# Patient Record
Sex: Female | Born: 1990 | Race: White | Hispanic: No | Marital: Single | Smoking: Current every day smoker
Health system: Southern US, Community
[De-identification: ages and names within clinical notes are randomized; demographics above are authoritative.]

## PROBLEM LIST (undated history)

## (undated) HISTORY — PX: CHOLECYSTECTOMY: SHX55

## (undated) HISTORY — PX: APPENDECTOMY: SHX54

---
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
## 2011-09-03 ENCOUNTER — Emergency Department (HOSPITAL_COMMUNITY)
Admission: EM | Admit: 2011-09-03 | Discharge: 2011-09-03 | Disposition: A | Discharge status: Home / Self Care | Payer: Self-pay | Attending: Emergency Medicine | Admitting: Emergency Medicine

## 2011-09-03 ENCOUNTER — Encounter (HOSPITAL_COMMUNITY): Payer: Self-pay | Admitting: *Deleted

## 2011-09-03 DIAGNOSIS — H00019 Hordeolum externum unspecified eye, unspecified eyelid: Secondary | ICD-10-CM | POA: Insufficient documentation

## 2011-09-03 DIAGNOSIS — H00016 Hordeolum externum left eye, unspecified eyelid: Secondary | ICD-10-CM

## 2011-09-03 MED ORDER — ERYTHROMYCIN 5 MG/GM OP OINT
TOPICAL_OINTMENT | Freq: Once | OPHTHALMIC | Status: AC
  Administered 2011-09-03: 17:00:00 via OPHTHALMIC
  Filled 2011-09-03: qty 3.5

## 2011-09-03 NOTE — Discharge Instructions (Signed)
Apply a small ribbon of the antibiotic ointment to your left eye three times daily.  Wash your eyelid twice daily with baby shampoo as discussed.  Avoid rubbing your lid as this will make your swelling worse.  Call Dr. Lita Mains for a recheck of your eye if your symptoms are not improving over the next several days.

## 2011-09-03 NOTE — ED Notes (Signed)
Pt presents with swollen left eye lid with small sty like area noted. Pt denies vision changes, injury or trauma to the eye. Erythromycin ointment applied.

## 2011-09-03 NOTE — ED Notes (Signed)
Left eye swollen onset yesterday, no known injury

## 2011-09-03 NOTE — ED Provider Notes (Signed)
History     CSN: [id number]  Arrival date & time 09/03/11  1439   First MD Initiated Contact with Patient 09/03/11 1535      Chief Complaint  Patient presents with  . Eye Problem    (Consider location/radiation/quality/duration/timing/severity/associated sxs/prior treatment) HPI Comments: Lauren Vaughn presents for evaluation of left upper eyelid swelling and irritation since yesterday.  She denies any known injury or foreign body.  She denies any pain or decreased visual acuity.  She does not wear contact lenses or glasses.  Her right eye was itchy yesterday but not her left, and this symptom has resolved.  She denies any significant seasonal allergies.  She denies fevers or chills, rashes or drainage from her eye.  The history is provided by the patient.    History reviewed. No pertinent past medical history.  History reviewed. No pertinent past surgical history.  No family history on file.  History  Substance Use Topics  . Smoking status: Not on file  . Smokeless tobacco: Not on file  . Alcohol Use: Not on file    OB History    Grav Para Term Preterm Abortions TAB SAB Ect Mult Living                  Review of Systems  Constitutional: Negative for fever.  HENT: Negative for congestion, sore throat and neck pain.   Eyes: Negative.  Negative for photophobia, pain, discharge, redness and visual disturbance.  Respiratory: Negative for chest tightness and shortness of breath.   Cardiovascular: Negative for chest pain.  Gastrointestinal: Negative for nausea and abdominal pain.  Genitourinary: Negative.   Musculoskeletal: Negative for joint swelling and arthralgias.  Skin: Negative.  Negative for rash and wound.  Neurological: Negative for dizziness, weakness, light-headedness, numbness and headaches.  Hematological: Negative.   Psychiatric/Behavioral: Negative.     Allergies  Review of patient's allergies indicates no known allergies.  Home Medications    Current Outpatient Rx  Name Route Sig Dispense Refill  . IBUPROFEN 200 MG PO TABS Oral Take 400 mg by mouth once as needed. For pain      BP 137/66  Pulse 93  Temp(Src) 97.9 F (36.6 C) (Oral)  Resp 18  Ht 5\' 7"  (1.702 m)  Wt 220 lb (99.791 kg)  BMI 34.46 kg/m2  SpO2 100%  LMP 09/03/2011  Physical Exam  Nursing note and vitals reviewed. Constitutional: She appears well-developed and well-nourished.  HENT:  Head: Normocephalic and atraumatic.  Right Ear: Tympanic membrane and external ear normal.  Left Ear: Tympanic membrane and external ear normal.  Nose: Nose normal.  Eyes: Conjunctivae and EOM are normal. Pupils are equal, round, and reactive to light. Right eye exhibits no chemosis, no discharge and no exudate. Left eye exhibits hordeolum. Left eye exhibits no chemosis, no discharge and no exudate. No foreign body present in the left eye.       Slight erythema with edema of left upper eyelid.  Erythema is most pronounced on the along left lateral eyelid and eyelashes margin.  There is no pointing at this time.  No crusting of eyelashes.  Neck: Normal range of motion.  Cardiovascular: Normal rate.   Pulmonary/Chest: Effort normal and breath sounds normal.  Musculoskeletal: Normal range of motion.  Neurological: She is alert.  Skin: Skin is warm and dry.  Psychiatric: She has a normal mood and affect.    ED Course  Procedures (including critical care time)  Labs Reviewed - No data to  display No results found.   1. Stye, left       MDM  Probable left early stye.  Cannot rule out blepharitis.  Will treat with erythromycin ointment.  Patient also encouraged to gently wash her eyelids twice daily with baby shampoo prior to applying the ointment.  Recheck if not improving over the next several days.  Given Dr. Lita Mains for referral if symptoms worsen in any way.         Burgess Amor, [place] 09/03/11 [phone]

## 2011-09-04 NOTE — ED Provider Notes (Signed)
Medical screening examination/treatment/procedure(s) were performed by non-physician practitioner and as supervising physician I was immediately available for consultation/collaboration.  Angles Trevizo S. Kashish Yglesias, MD 09/04/11 1422 

## 2011-11-27 IMAGING — CT CT ABD-PELV W/ CM
2 of 4 series · 17 of 46 positions shown, 19 images · IV contrast (Omnipaque 300)
Comparison: None available

CLINICAL DATA: Right lower quadrant abdominal pain.  Nausea and
vomiting.

CT ABDOMEN AND PELVIS WITH CONTRAST
TECHNIQUE: Multidetector CT imaging of the abdomen and pelvis was
performed following the standard protocol during bolus
administration of intravenous contrast.
Contrast: 100 ml Omnipaque 300

[Series 2: abd_pel_with 5.0 b40f · axial · 0.90mm/px · z∈[-501,-71]mm · 14 of 94 slices shown, 16 images]
[im 4/94  soft-tissue]
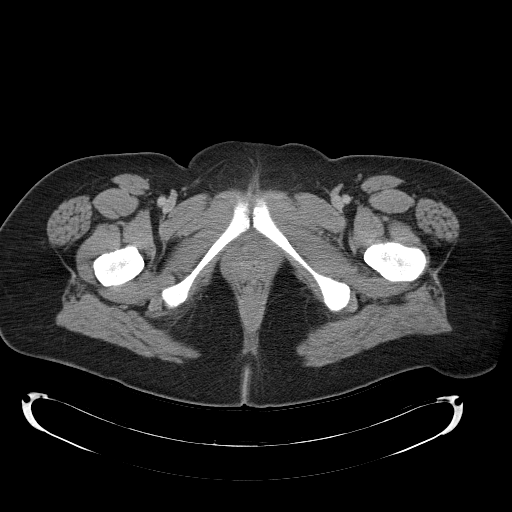
[im 4/94  bone]
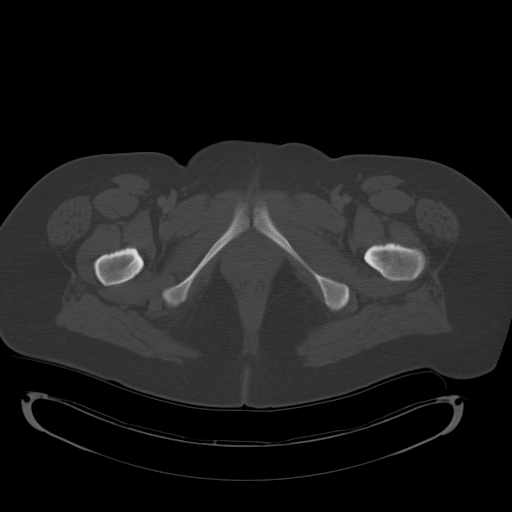
[im 11/94  soft-tissue]
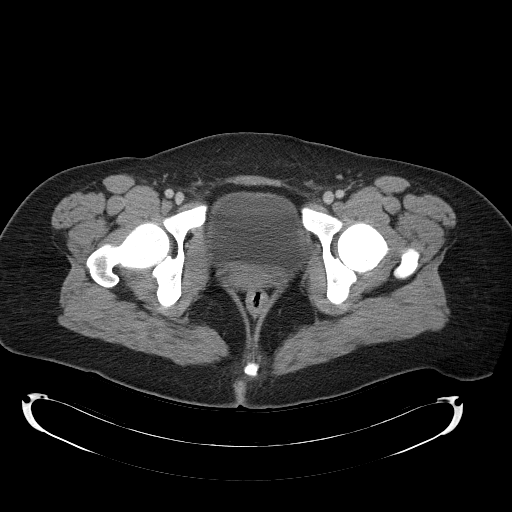
[im 18/94  soft-tissue]
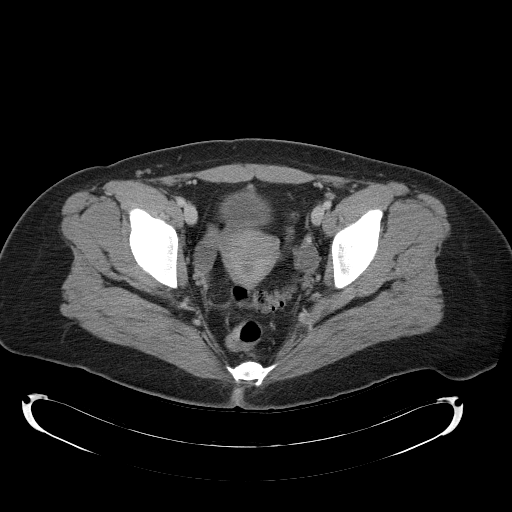
[im 26/94  soft-tissue]
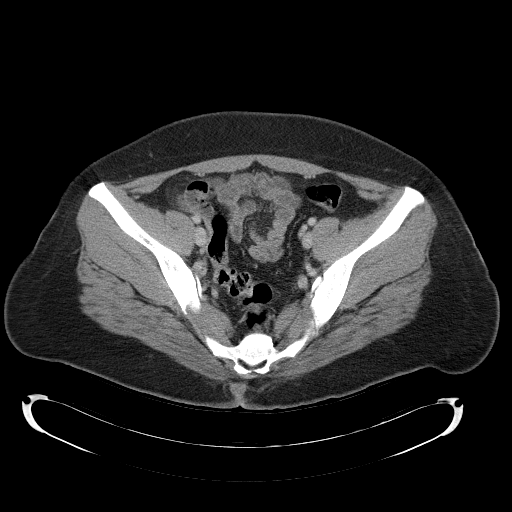
[im 33/94  soft-tissue]
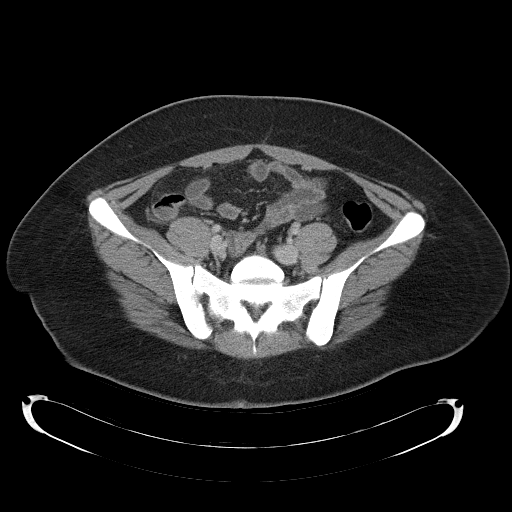
[im 36/94  soft-tissue]
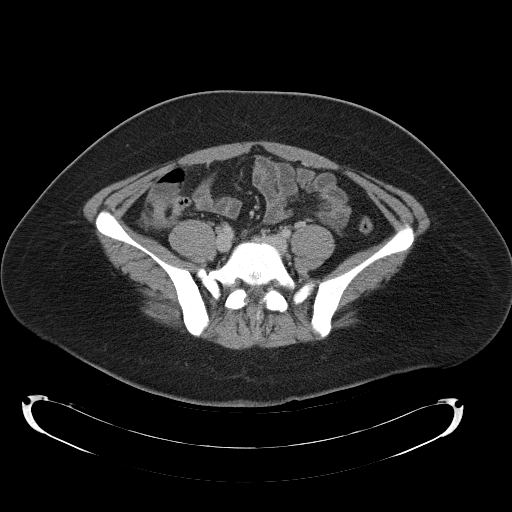
[im 43/94  soft-tissue]
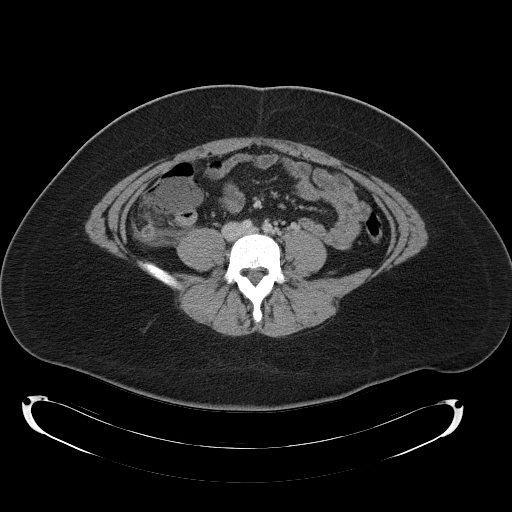
[im 51/94  soft-tissue]
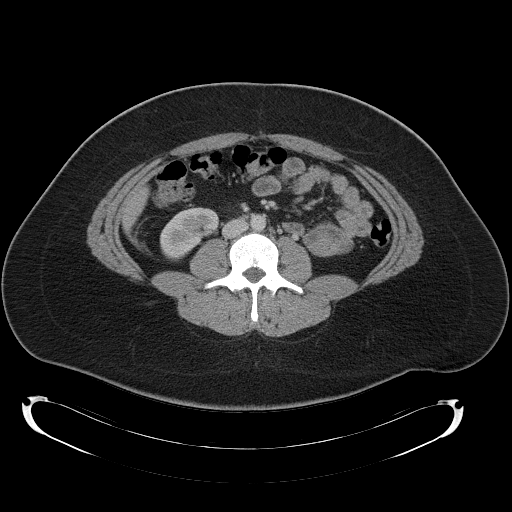
[im 58/94  soft-tissue]
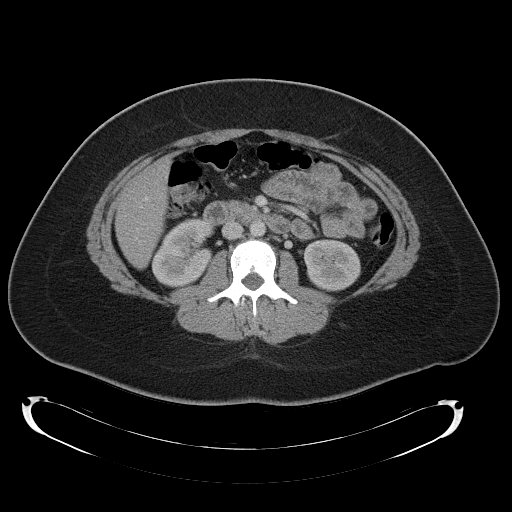
[im 58/94  bone]
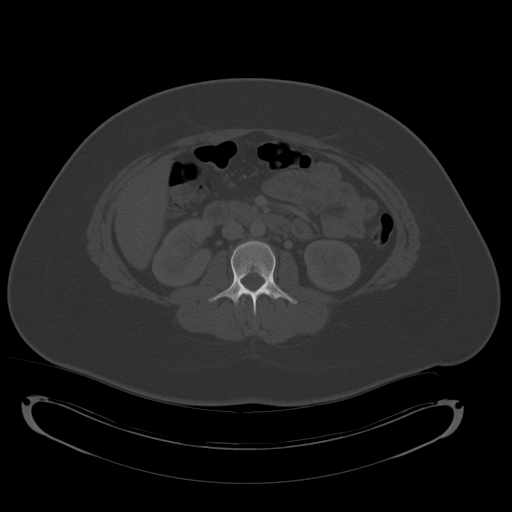
[im 61/94  soft-tissue]
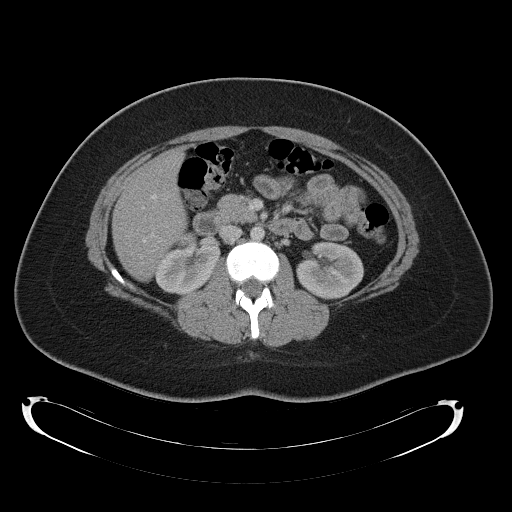
[im 68/94  soft-tissue]
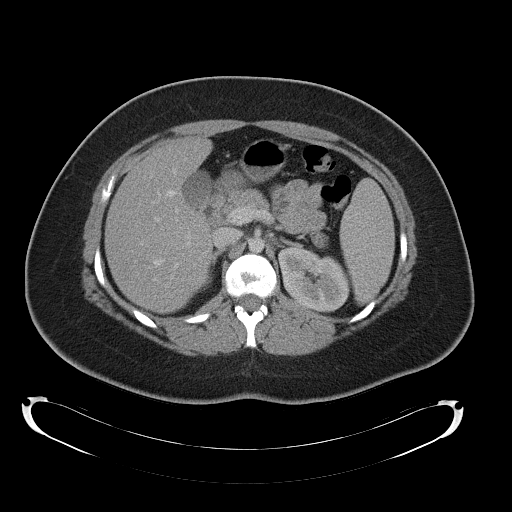
[im 76/94  soft-tissue]
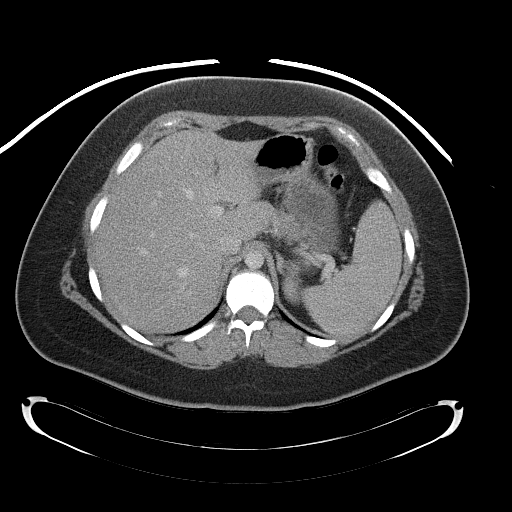
[im 83/94  soft-tissue]
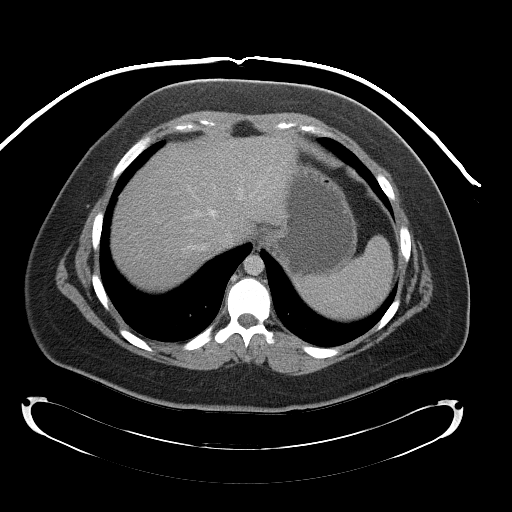
[im 90/94  soft-tissue]
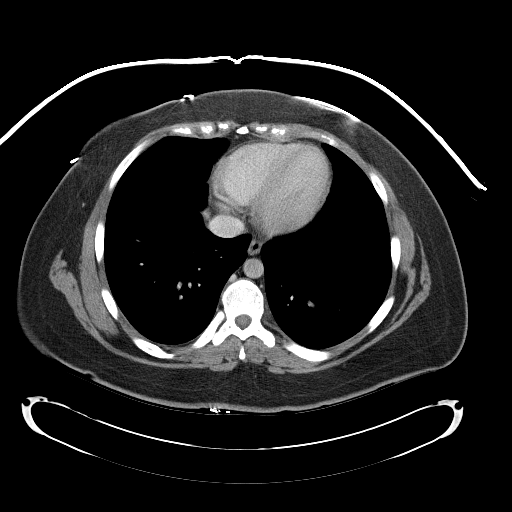

[Series 4: abd_pel_with 3.0 spo cor · coronal · 0.88mm/px · 3 of 94 slices shown]
[im 32/94  soft-tissue]
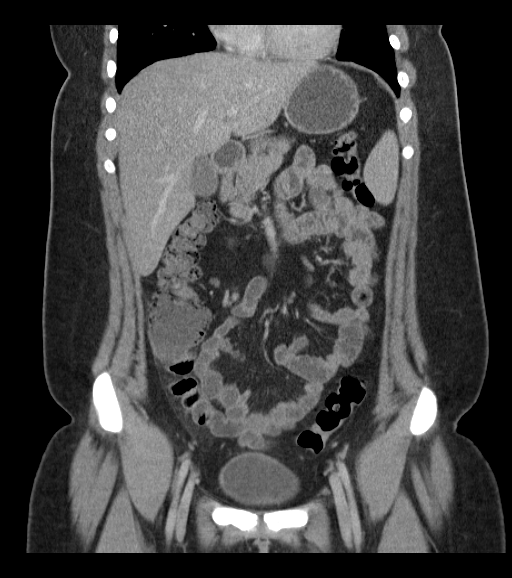
[im 42/94  soft-tissue]
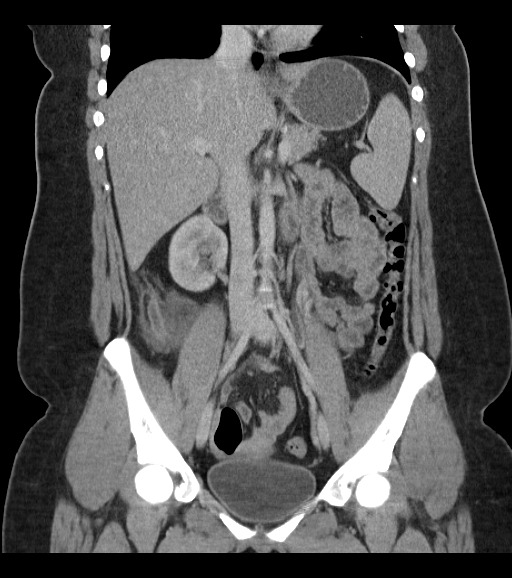
[im 52/94  soft-tissue]
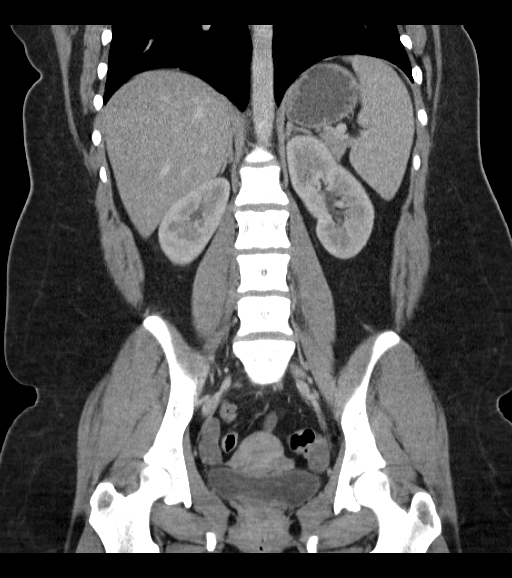

[17 of 46 positions shown; findings below may reference images not displayed]

FINDINGS: The lung bases are clear without focal nodule, mass, or
airspace disease.  The heart size is normal.  No significant
pleural or pericardial effusions are present.

There is probable diffuse fatty infiltration of the liver.  No
focal hepatic lesions are present.  The spleen is within normal
limits.  The pancreas, duodenum, and stomach are normal.  The
common bile duct and gallbladder are within normal limits.  The
adrenal glands are normal bilaterally.  The kidneys and ureters are
within normal limits bilaterally.

The rectosigmoid colon is within normal limits.  The remainder of
the colon is unremarkable.  The appendix is enlarged with extensive
inflammatory change.  The appendix measures 9.6 mm.  There is free
fluid surrounding the appendix.  No free air is evident.

The uterus and adnexa are within normal limits for age.  The
urinary bladder is normal.

The bone windows are unremarkable.
IMPRESSION: 1.  Appendicitis with moderate inflammatory change and free fluid
associated.
2.  No evidence for free air to suggest rupture.
3.  Otherwise unremarkable CT of the abdomen and pelvis.

## 2013-03-09 ENCOUNTER — Encounter (HOSPITAL_COMMUNITY): Payer: Self-pay | Admitting: Emergency Medicine

## 2013-03-09 ENCOUNTER — Emergency Department (HOSPITAL_COMMUNITY): Payer: Self-pay

## 2013-03-09 ENCOUNTER — Emergency Department (HOSPITAL_COMMUNITY)
Admission: EM | Admit: 2013-03-09 | Discharge: 2013-03-09 | Disposition: A | Discharge status: Home / Self Care | Payer: Self-pay | Attending: Emergency Medicine | Admitting: Emergency Medicine

## 2013-03-09 DIAGNOSIS — F17200 Nicotine dependence, unspecified, uncomplicated: Secondary | ICD-10-CM | POA: Insufficient documentation

## 2013-03-09 DIAGNOSIS — S93402A Sprain of unspecified ligament of left ankle, initial encounter: Secondary | ICD-10-CM

## 2013-03-09 DIAGNOSIS — W64XXXA Exposure to other animate mechanical forces, initial encounter: Secondary | ICD-10-CM | POA: Insufficient documentation

## 2013-03-09 DIAGNOSIS — W108XXA Fall (on) (from) other stairs and steps, initial encounter: Secondary | ICD-10-CM | POA: Insufficient documentation

## 2013-03-09 DIAGNOSIS — Y9289 Other specified places as the place of occurrence of the external cause: Secondary | ICD-10-CM | POA: Insufficient documentation

## 2013-03-09 DIAGNOSIS — Z791 Long term (current) use of non-steroidal anti-inflammatories (NSAID): Secondary | ICD-10-CM | POA: Insufficient documentation

## 2013-03-09 DIAGNOSIS — S93401A Sprain of unspecified ligament of right ankle, initial encounter: Secondary | ICD-10-CM

## 2013-03-09 DIAGNOSIS — Z975 Presence of (intrauterine) contraceptive device: Secondary | ICD-10-CM | POA: Insufficient documentation

## 2013-03-09 DIAGNOSIS — Y9389 Activity, other specified: Secondary | ICD-10-CM | POA: Insufficient documentation

## 2013-03-09 DIAGNOSIS — S93409A Sprain of unspecified ligament of unspecified ankle, initial encounter: Secondary | ICD-10-CM | POA: Insufficient documentation

## 2013-03-09 MED ORDER — HYDROCODONE-ACETAMINOPHEN 5-325 MG PO TABS
1.0000 | ORAL_TABLET | Freq: Once | ORAL | Status: AC
  Administered 2013-03-09: 1 via ORAL
  Filled 2013-03-09: qty 1

## 2013-03-09 MED ORDER — IBUPROFEN 800 MG PO TABS
800.0000 mg | ORAL_TABLET | Freq: Once | ORAL | Status: AC
  Administered 2013-03-09: 800 mg via ORAL
  Filled 2013-03-09: qty 1

## 2013-03-09 MED ORDER — IBUPROFEN 800 MG PO TABS: 800.0000 mg | ORAL_TABLET | Freq: Three times a day (TID) | ORAL | Status: AC

## 2013-03-09 MED ORDER — HYDROCODONE-ACETAMINOPHEN 5-325 MG PO TABS: ORAL_TABLET | ORAL | Status: DC

## 2013-03-09 NOTE — ED Notes (Signed)
bil ankle pain,lateral malleolus, Good bil dp pulses, and distal sensation, Mild swelling bil malleolus.Ice packs applied.

## 2013-03-09 NOTE — ED Notes (Signed)
Tripped by dog and fell down 2 steps today  Bil ankle pain.  No HI or LOC , alert

## 2013-03-11 NOTE — ED Provider Notes (Signed)
CSN: [id number]     Arrival date & time 03/09/13  1103 History   First MD Initiated Contact with Patient 03/09/13 1207     Chief Complaint  Patient presents with  . Ankle Pain   (Consider location/radiation/quality/duration/timing/severity/associated sxs/prior Treatment) Patient is a 22 y.o. female presenting with ankle pain. The history is provided by the patient.  Ankle Pain Location:  Ankle Injury: yes   Mechanism of injury: fall   Fall:    Fall occurred:  Down stairs (2 steps)   Impact surface:  Hard floor   Point of impact: left side.   Entrapped after fall: no   Ankle location:  L ankle and R ankle Pain details:    Quality:  Throbbing   Radiates to:  Does not radiate   Severity:  Moderate   Onset quality:  Sudden   Timing:  Constant   Progression:  Unchanged Chronicity:  New Dislocation: no   Foreign body present:  No foreign bodies Prior injury to area:  No Relieved by:  Nothing Worsened by:  Activity, bearing weight and flexion Ineffective treatments:  None tried Associated symptoms: decreased ROM and swelling   Associated symptoms: no back pain, no fever, no neck pain, no numbness, no stiffness and no tingling     History reviewed. No pertinent past medical history. Past Surgical History  Procedure Laterality Date  . Appendectomy     History reviewed. No pertinent family history. History  Substance Use Topics  . Smoking status: Current Every Day Smoker    Types: Cigarettes  . Smokeless tobacco: Not on file  . Alcohol Use: No   OB History   Grav Para Term Preterm Abortions TAB SAB Ect Mult Living                 Review of Systems  Constitutional: Negative for fever and chills.  Genitourinary: Negative for dysuria and difficulty urinating.  Musculoskeletal: Positive for arthralgias and joint swelling. Negative for back pain, neck pain and stiffness.  Skin: Negative for color change and wound.  All other systems reviewed and are  negative.    Allergies  Review of patient's allergies indicates no known allergies.  Home Medications   Current Outpatient Rx  Name  Route  Sig  Dispense  Refill  . levonorgestrel (MIRENA) 20 MCG/24HR IUD   Intrauterine   1 each by Intrauterine route once.         Marland Kitchen HYDROcodone-acetaminophen (NORCO/VICODIN) 5-325 MG per tablet      Take one-two tabs po q 4-6 hrs prn pain   10 tablet   0   . ibuprofen (ADVIL,MOTRIN) 800 MG tablet   Oral   Take 1 tablet (800 mg total) by mouth 3 (three) times daily.   21 tablet   0    BP 123/58  Pulse 88  Temp(Src) 98.6 F (37 C) (Oral)  Resp 18  Ht 5\' 7"  (1.702 m)  Wt 220 lb (99.791 kg)  BMI 34.45 kg/m2  SpO2 98%  LMP 03/02/2013 Physical Exam  Nursing note and vitals reviewed. Constitutional: She is oriented to person, place, and time. She appears well-developed and well-nourished. No distress.  HENT:  Head: Normocephalic and atraumatic.  Cardiovascular: Normal rate, regular rhythm, normal heart sounds and intact distal pulses.   Pulmonary/Chest: Effort normal and breath sounds normal.  Musculoskeletal: She exhibits edema and tenderness.  ttp of bilateral ankles, left lateral greater than right.  mild STS is present.  ROM is preserved.  DP pulse  is brisk,distal sensation intact.  No erythema, abrasion, bruising or bony deformity.  No proximal tenderness. No spinal tenderness on exam  Neurological: She is alert and oriented to person, place, and time. She exhibits normal muscle tone. Coordination normal.  Skin: Skin is warm and dry.    ED Course  Procedures (including critical care time) Labs Review Labs Reviewed - No data to display Imaging Review Dg Ankle Complete Left  03/09/2013   CLINICAL DATA:  Left ankle pain.  EXAM: LEFT ANKLE COMPLETE - 3+ VIEW  COMPARISON:  Left foot 11/19/2006  FINDINGS: There is no evidence of fracture, dislocation, or joint effusion. There is no evidence of arthropathy or other focal bone  abnormality. Soft tissues are unremarkable.  IMPRESSION: No acute osseous injury of the left ankle.   Electronically Signed   By: Elige Ko   On: 03/09/2013 11:50   Dg Ankle Complete Right  03/09/2013   CLINICAL DATA:  Bilateral ankle pain and swelling.  EXAM: RIGHT ANKLE - COMPLETE 3+ VIEW  COMPARISON:  None.  FINDINGS: There is no evidence of fracture, dislocation, or joint effusion. There is no evidence of arthropathy or other focal bone abnormality. Soft tissues are unremarkable.  IMPRESSION: No acute osseous injury of the right ankle.   Electronically Signed   By: Elige Ko   On: 03/09/2013 11:47    EKG Interpretation   None       MDM   1. Ankle sprain, left, initial encounter   2. Ankle sprain, right, initial encounter    ASO applied to left ankle and crutches given.  Pt agrees to RICE therapy and ortho f/u if the pain is not improving.      Cayetano Mikita L. Anthony Roland, PA-C 03/11/13 1319

## 2013-03-12 NOTE — ED Provider Notes (Signed)
Medical screening examination/treatment/procedure(s) were performed by non-physician practitioner and as supervising physician I was immediately available for consultation/collaboration.  EKG Interpretation   None         Joslin Doell, MD 03/12/13 0942 

## 2014-07-19 IMAGING — CR DG ANKLE COMPLETE 3+V*R*
3 series · 3 of 3 positions shown · non-contrast
Comparison: None.

CLINICAL DATA: Bilateral ankle pain and swelling.

EXAM:
RIGHT ANKLE - COMPLETE 3+ VIEW

[view not recorded (1 of 3)]
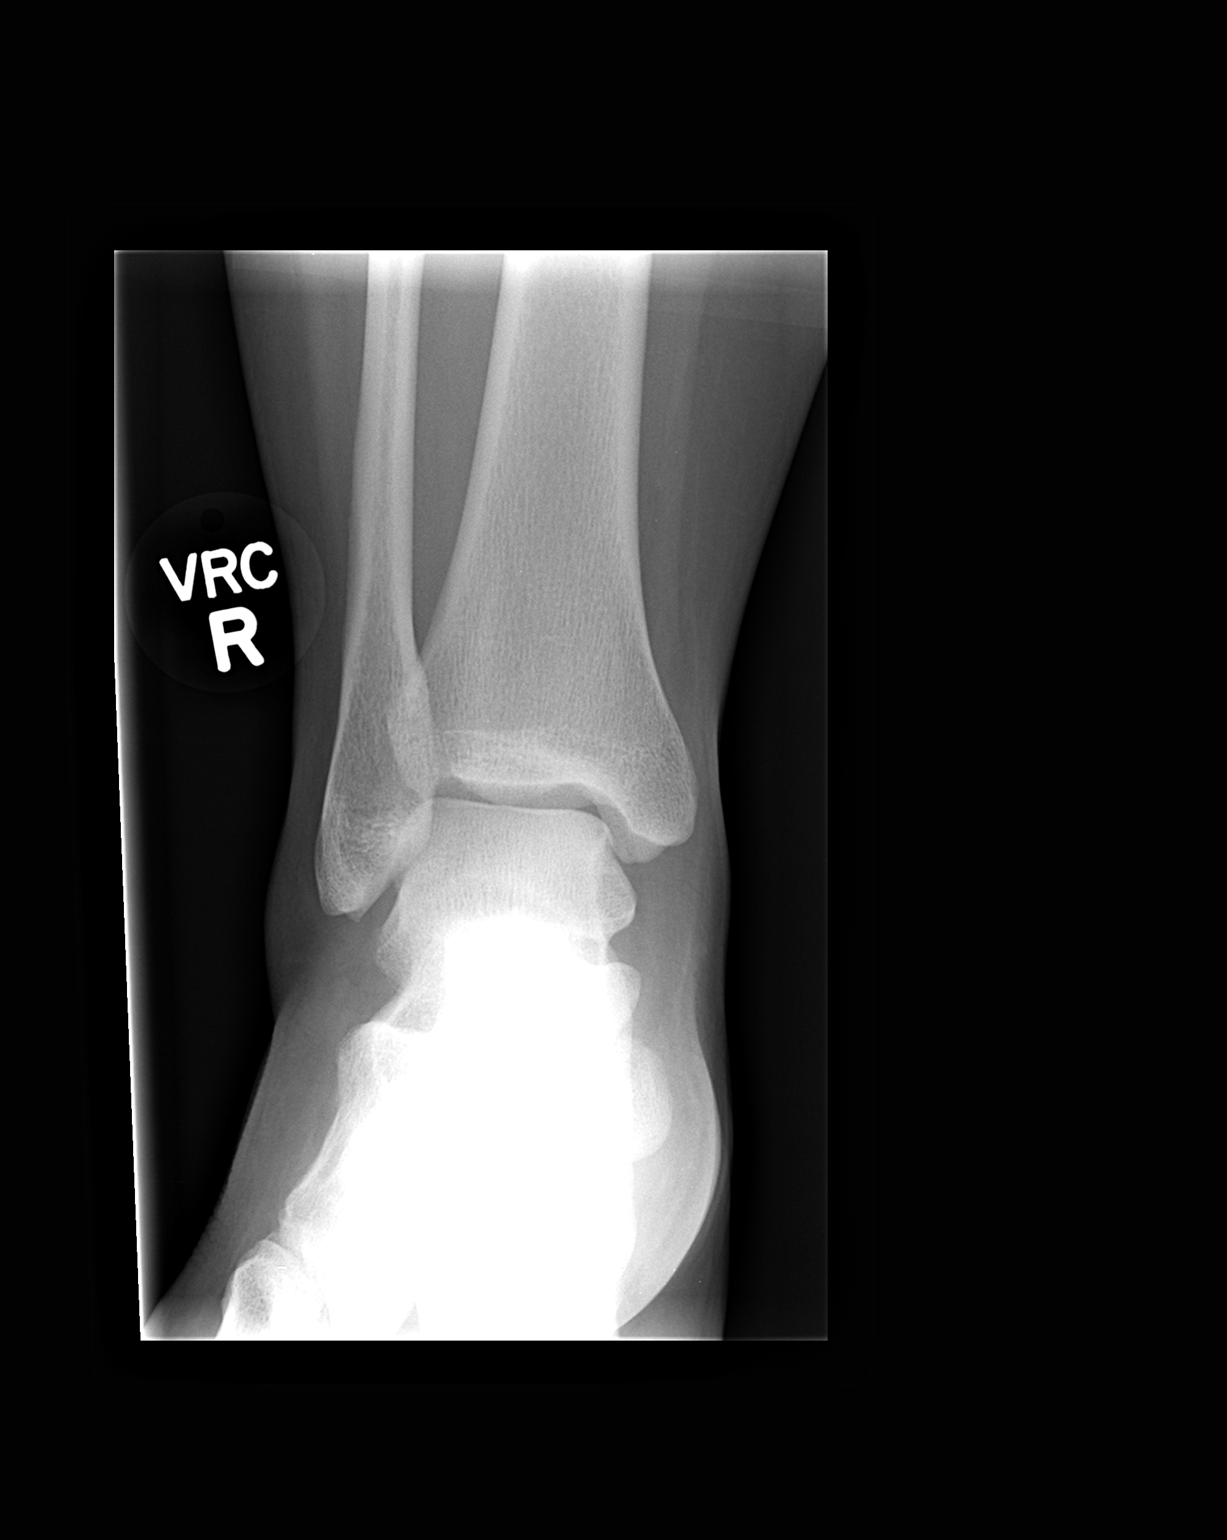

[view not recorded (2 of 3)]
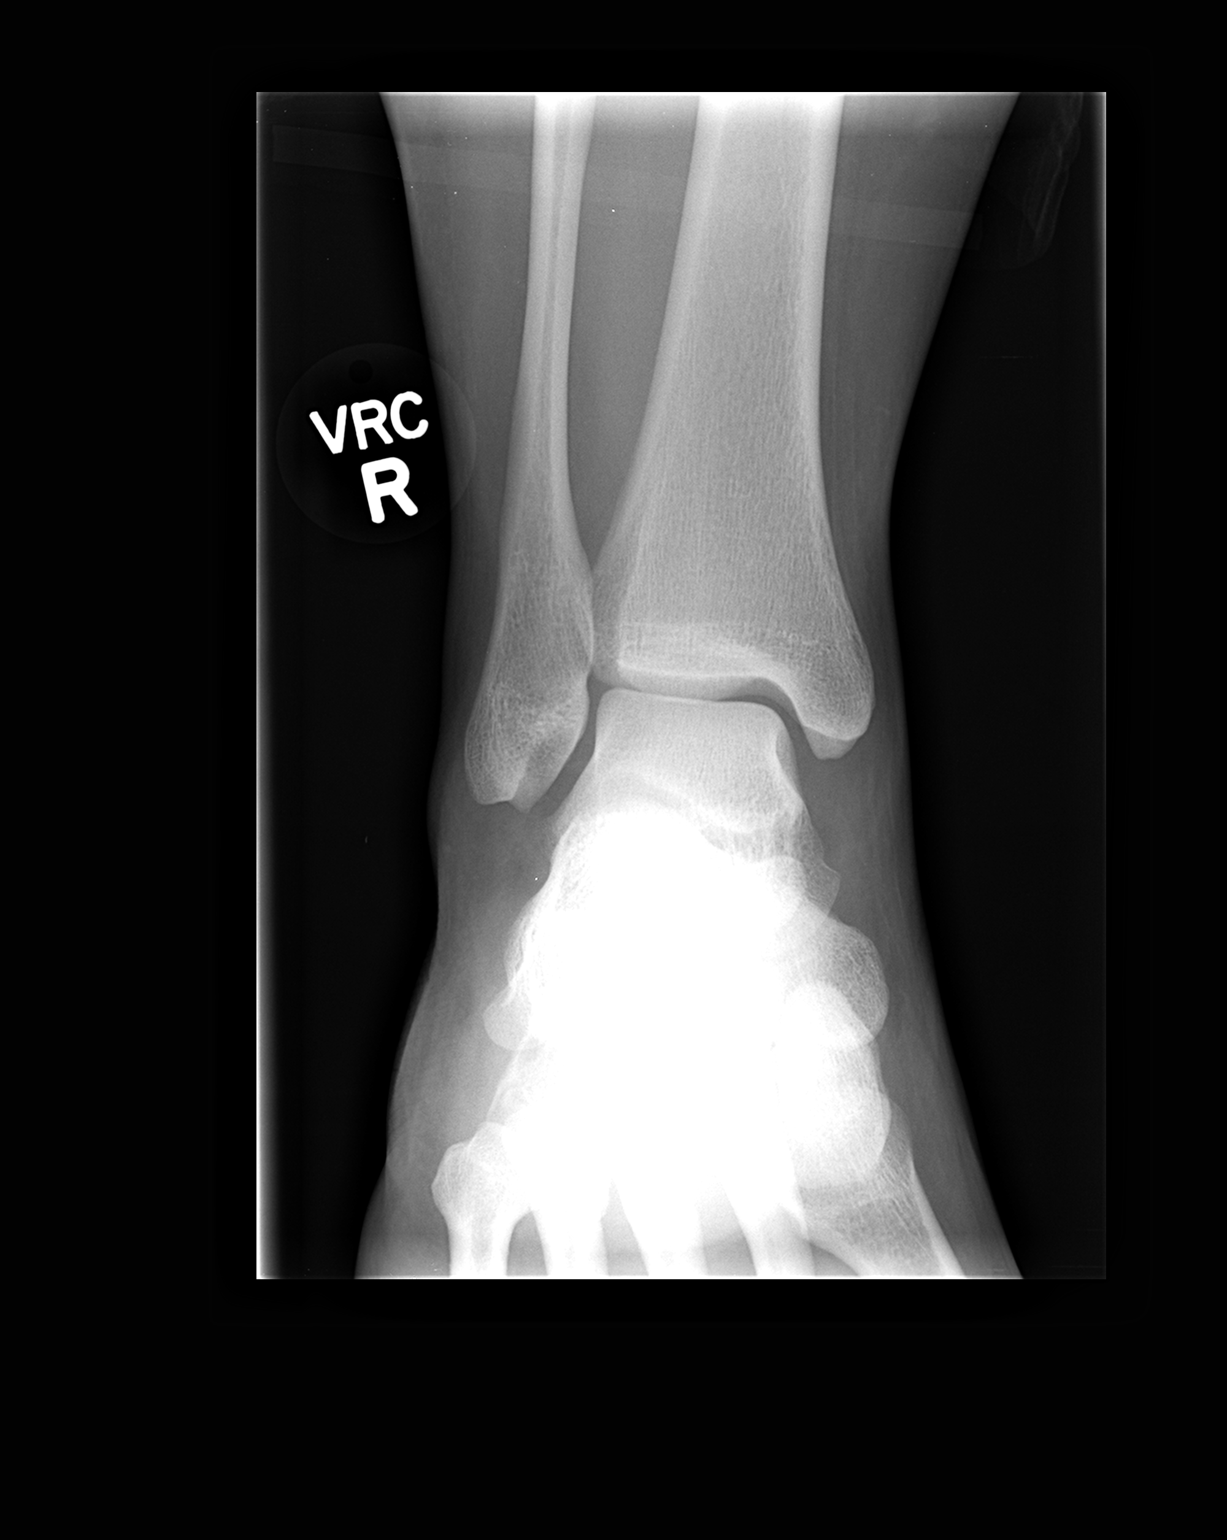

[view not recorded (3 of 3)]
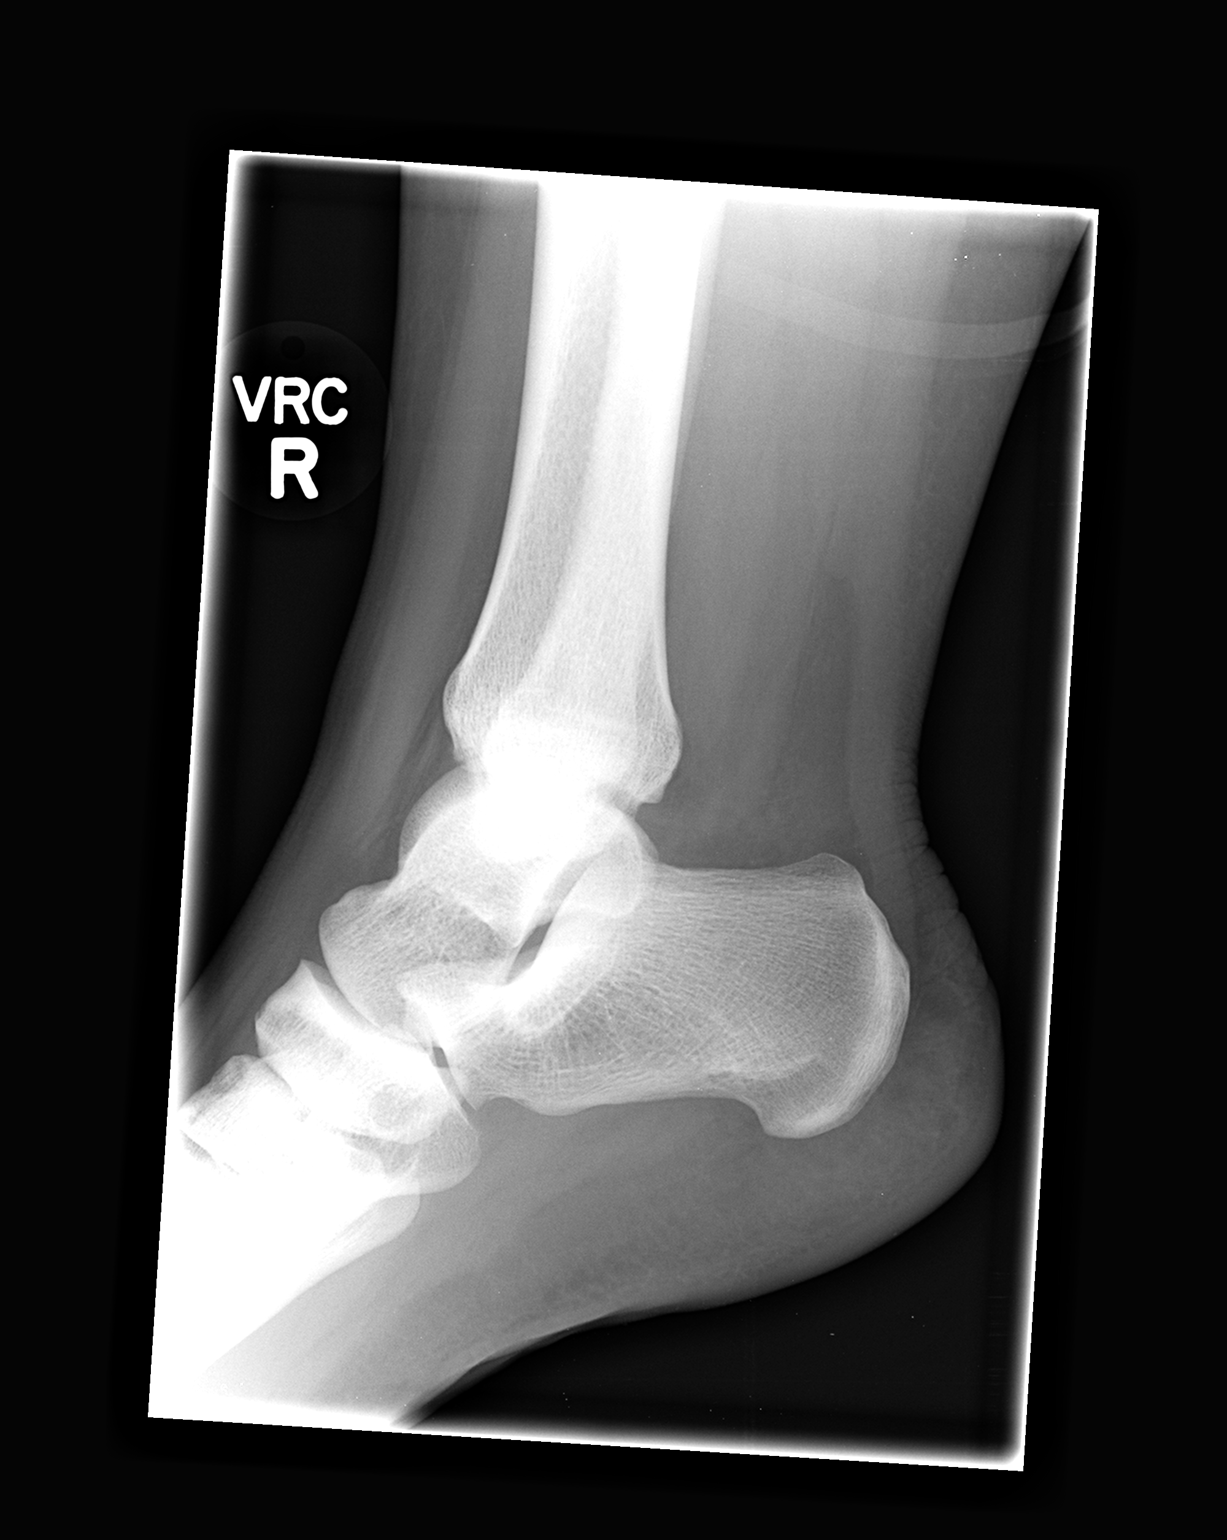

[3 of 3 positions shown; findings below may reference images not displayed]

FINDINGS: There is no evidence of fracture, dislocation, or joint effusion.
There is no evidence of arthropathy or other focal bone abnormality.
Soft tissues are unremarkable.
IMPRESSION: No acute osseous injury of the right ankle.

## 2015-12-25 ENCOUNTER — Encounter (HOSPITAL_COMMUNITY): Payer: Self-pay | Admitting: Emergency Medicine

## 2015-12-25 ENCOUNTER — Emergency Department (HOSPITAL_COMMUNITY)
Admission: EM | Admit: 2015-12-25 | Discharge: 2015-12-25 | Disposition: A | Discharge status: Home / Self Care | Payer: Self-pay | Attending: Emergency Medicine | Admitting: Emergency Medicine

## 2015-12-25 DIAGNOSIS — Z79899 Other long term (current) drug therapy: Secondary | ICD-10-CM | POA: Insufficient documentation

## 2015-12-25 DIAGNOSIS — L0231 Cutaneous abscess of buttock: Secondary | ICD-10-CM | POA: Insufficient documentation

## 2015-12-25 DIAGNOSIS — Z791 Long term (current) use of non-steroidal anti-inflammatories (NSAID): Secondary | ICD-10-CM | POA: Insufficient documentation

## 2015-12-25 DIAGNOSIS — F17210 Nicotine dependence, cigarettes, uncomplicated: Secondary | ICD-10-CM | POA: Insufficient documentation

## 2015-12-25 MED ORDER — SULFAMETHOXAZOLE-TRIMETHOPRIM 800-160 MG PO TABS: 1.0000 | ORAL_TABLET | Freq: Two times a day (BID) | ORAL | 0 refills | Status: AC

## 2015-12-25 MED ORDER — HYDROCODONE-ACETAMINOPHEN 5-325 MG PO TABS: 2.0000 | ORAL_TABLET | ORAL | 0 refills | Status: AC | PRN

## 2015-12-25 MED ORDER — LIDOCAINE HCL (PF) 2 % IJ SOLN
INTRAMUSCULAR | Status: AC
  Filled 2015-12-25: qty 10

## 2015-12-25 NOTE — ED Provider Notes (Signed)
[facility]-EMERGENCY DEPT Provider Note   CSN: [id number] Arrival date & time:     By signing my name below, I, Placido SouLogan Joldersma, attest that this documentation has been prepared under the direction and in the presence of Cheron SchaumannLeslie Nashla Althoff, [place]PA-C. Electronically Signed: Placido SouLogan Joldersma, ED Scribe. 12/25/15. 6:17 PM.   History   Chief Complaint Chief Complaint  Patient presents with  . Abscess    HPI HPI Comments: Lauren Vaughn is a 25 y.o. female who presents to the Emergency Department complaining of a point of worsening pain and swelling to her right, posterior, upper thigh onset a few days ago. Pt reports a PMHx of abscess and confirms her symptoms are consistent with prior abscess but states they typically aren't of this severity and they usually drain and alleviate on their own. She states her pain radiates throughout her RLE and worsens with palpation. She reports associated, mild, fever and chills as well as mild redness surrounding the affected region. Pt denies any other known medical conditions or drug allergies. She has never taken abx for abscesses. Pt denies any other associated symptoms at this time.     The history is provided by the patient. No language interpreter was used.    History reviewed. No pertinent past medical history.  There are no active problems to display for this patient.   Past Surgical History:  Procedure Laterality Date  . APPENDECTOMY    . CHOLECYSTECTOMY      OB History    No data available       Home Medications    Prior to Admission medications   Medication Sig Start Date End Date Taking? Authorizing Provider  HYDROcodone-acetaminophen (NORCO/VICODIN) 5-325 MG per tablet Take one-two tabs po q 4-6 hrs prn pain 03/09/13   Tammy Triplett, PA-C  ibuprofen (ADVIL,MOTRIN) 800 MG tablet Take 1 tablet (800 mg total) by mouth 3 (three) times daily. 03/09/13   Tammy Triplett, PA-C  levonorgestrel (MIRENA) 20 MCG/24HR IUD 1 each by Intrauterine route  once.    Historical Provider, MD    Family History History reviewed. No pertinent family history.  Social History Social History  Substance Use Topics  . Smoking status: Current Every Day Smoker    Types: Cigarettes  . Smokeless tobacco: Never Used  . Alcohol use No     Allergies   Review of patient's allergies indicates no known allergies.   Review of Systems Review of Systems  Constitutional: Positive for chills and fever.  Musculoskeletal: Positive for myalgias.  Skin: Positive for color change and rash.  All other systems reviewed and are negative.  Physical Exam Updated Vital Signs BP 169/73 (BP Location: Left Arm)   Pulse 105   Temp 100.8 F (38.2 C) (Oral)   Resp 16   Ht 5\' 6"  (1.676 m)   Wt 220 lb (99.8 kg)   LMP 12/11/2015   SpO2 99%   BMI 35.51 kg/m   Physical Exam  Constitutional: She is oriented to person, place, and time. She appears well-developed.  HENT:  Head: Normocephalic and atraumatic.  Eyes: EOM are normal.  Neck: Normal range of motion.  Cardiovascular: Normal rate.   Pulmonary/Chest: Effort normal. No respiratory distress.  Abdominal: Soft.  Genitourinary:  Genitourinary Comments: 3 cm x 4 cm abscess to the right buttock with an indurated and fluctuant center.   Musculoskeletal: Normal range of motion.  Neurological: She is alert and oriented to person, place, and time.  Skin: Skin is warm and dry.  Psychiatric:  She has a normal mood and affect.  Nursing note and vitals reviewed.  ED Treatments / Results  Labs (all labs ordered are listed, but only abnormal results are displayed) Labs Reviewed - No data to display  EKG  EKG Interpretation None       Radiology No results found.  Procedures .Marland Kitchen.Incision and Drainage Date/Time: 12/25/2015 6:26 PM Performed by: Elson AreasSOFIA, Faatima Tench K Authorized by: Bethann BerkshireZAMMIT, JOSEPH   Consent:    Consent obtained:  Verbal   Consent given by:  Patient   Risks discussed:  Pain Location:    Type:   Abscess   Size:  3 cm x 4 cm   Location:  Lower extremity   Lower extremity location:  Leg   Leg location:  R upper leg Anesthesia (see MAR for exact dosages):    Anesthesia method:  Local infiltration   Local anesthetic:  Lidocaine 2% w/o epi Procedure type:    Complexity:  Simple Procedure details:    Incision types:  Single straight   Scalpel blade:  11   Wound management:  Irrigated with saline   Drainage:  Purulent (yellow)   Drainage amount:  Copious   Wound treatment:  Wound left open   Packing materials:  1/4 in gauze Post-procedure details:    Patient tolerance of procedure:  Tolerated well, no immediate complications    DIAGNOSTIC STUDIES: Oxygen Saturation is 99% on RA, normal by my interpretation.    COORDINATION OF CARE: 6:16 PM Discussed next steps with pt. Pt verbalized understanding and is agreeable with the plan.    Medications Ordered in ED Medications - No data to display   Initial Impression / Assessment and Plan / ED Course  I have reviewed the triage vital signs and the nursing notes.  Pertinent labs & imaging results that were available during my care of the patient were reviewed by me and considered in my medical decision making (see chart for details).  Clinical Course    Patient with skin abscess. Incision and drainage performed in the ED today.  Abscess was not large enough to warrant packing or drain placement. Wound recheck in 2 days. Supportive care and return precautions discussed.  Pt sent home with  rx for hydrocodone and bactrim The patient appears reasonably screened and/or stabilized for discharge and I doubt any other emergent medical condition requiring further screening, evaluation, or treatment in the ED prior to discharge.   Final Clinical Impressions(s) / ED Diagnoses   Final diagnoses:  Abscess of buttock, right    New Prescriptions Discharge Medication List as of 12/25/2015  6:29 PM    START taking these medications    Details  sulfamethoxazole-trimethoprim (BACTRIM DS,SEPTRA DS) 800-160 MG tablet Take 1 tablet by mouth 2 (two) times daily., Starting Mon 12/25/2015, Until Mon 01/01/2016, Print         Lonia SkinnerLeslie K LyonSofia, PA-C 12/25/15 2349    Bethann BerkshireJoseph Zammit, MD 12/27/15 [phone]

## 2015-12-25 NOTE — ED Triage Notes (Signed)
Patient complaining of abscess to back of right upper leg.

## 2015-12-25 NOTE — Discharge Instructions (Signed)
Remove packing in 2 days

## 2015-12-28 LAB — AEROBIC CULTURE  (SUPERFICIAL SPECIMEN)

## 2015-12-28 LAB — AEROBIC CULTURE W GRAM STAIN (SUPERFICIAL SPECIMEN)

## 2015-12-29 ENCOUNTER — Telehealth (HOSPITAL_BASED_OUTPATIENT_CLINIC_OR_DEPARTMENT_OTHER): Payer: Self-pay | Admitting: Emergency Medicine

## 2015-12-29 NOTE — Telephone Encounter (Signed)
Post ED Visit - Positive Culture Follow-up: Successful Patient Follow-Up  Culture assessed and recommendations reviewed by: []  Enzo BiNathan Batchelder, Pharm.D. []  Celedonio MiyamotoJeremy Frens, Pharm.D., BCPS []  Garvin FilaMike Maccia, Pharm.D. []  Georgina PillionElizabeth Martin, Pharm.D., BCPS []  Fruitland ParkMinh Pham, [address] Rainbow BoulevardPharm.D., BCPS, AAHIVP []  Estella HuskMichelle Turner, Pharm.D., BCPS, AAHIVP []  Tennis Mustassie Stewart, Pharm.D. []  Sherle Poeob Vincent, Vermont[place]arm[place]D. Vianne BullsKai Kong RPh  Positive wound culture  []  Patient discharged without antimicrobial prescription and treatment is now indicated [x]  Organism is resistant to prescribed ED discharge antimicrobial []  Patient with positive blood cultures  Changes discussed with ED provider: Burna FortsJeff Hedges PA New antibiotic prescription stop bactrim, Start Cefuroxime 500mg  po q 12 hours po x 7 days  Attempting to contact patient    Berle MullMiller, Alegandro Macnaughton 12/29/2015, 3:53 PM

## 2015-12-29 NOTE — Progress Notes (Signed)
ED Antimicrobial Stewardship Positive Culture Follow Up   Lauren Vaughn is an 25 y.o. female [facility] with a chief complaint of  Chief Complaint  Patient presents with  . Abscess    Recent Results (from the past 720 hour(s))  Wound or Superficial Culture     Status: None   Collection Time: 12/25/15  6:30 PM  Result Value Ref Range Status   Specimen Description ABSCESS RIGHT BUTTOCKS  Final   Special Requests ONLY AEB SWAB SUBMITTED  Final   Gram Stain   Final    RARE WBC PRESENT, PREDOMINANTLY PMN NO ORGANISMS SEEN Performed at [facility]    Culture FEW KLEBSIELLA PNEUMONIAE  Final   Report Status 12/28/2015 FINAL  Final   Organism ID, Bacteria KLEBSIELLA PNEUMONIAE  Final      Susceptibility   Klebsiella pneumoniae - MIC*    AMPICILLIN >=32 RESISTANT Resistant     CEFAZOLIN <=4 SENSITIVE Sensitive     CEFEPIME <=1 SENSITIVE Sensitive     CEFTAZIDIME <=1 SENSITIVE Sensitive     CEFTRIAXONE <=1 SENSITIVE Sensitive     CIPROFLOXACIN <=0.25 SENSITIVE Sensitive     GENTAMICIN <=1 SENSITIVE Sensitive     IMIPENEM <=0.25 SENSITIVE Sensitive     TRIMETH/SULFA >=320 RESISTANT Resistant     AMPICILLIN/SULBACTAM 16 INTERMEDIATE Intermediate     PIP/TAZO <=4 SENSITIVE Sensitive     Extended ESBL NEGATIVE Sensitive     * FEW KLEBSIELLA PNEUMONIAE    [x]  Treated with Bactrim, organism resistant to prescribed antimicrobial  New antibiotic prescription: Cefuroxime PO 500mg  BID for 7 days. Stop bactrim.  ED Provider: Burna FortsJeff Hedges, Lauren Vaughn  Carroll SageKai Dillinger Aston (Kenny), PharmD  PGY1 Pharmacy Resident Pager: [phone] 12/29/2015 10:28 AM

## 2018-12-14 ENCOUNTER — Other Ambulatory Visit: Payer: Self-pay | Admitting: Nurse Practitioner

## 2018-12-14 DIAGNOSIS — Z803 Family history of malignant neoplasm of breast: Secondary | ICD-10-CM

## 2019-01-09 ENCOUNTER — Other Ambulatory Visit: Payer: Self-pay

## 2020-02-14 ENCOUNTER — Other Ambulatory Visit: Payer: Self-pay | Admitting: Nurse Practitioner

## 2020-02-14 DIAGNOSIS — Z9189 Other specified personal risk factors, not elsewhere classified: Secondary | ICD-10-CM

## 2020-02-14 DIAGNOSIS — Z803 Family history of malignant neoplasm of breast: Secondary | ICD-10-CM
